# Patient Record
Sex: Female | Born: 1975 | Race: Black or African American | Hispanic: No | Marital: Single | Smoking: Never smoker
Health system: Southern US, Community
[De-identification: ages and names within clinical notes are randomized; demographics above are authoritative.]

---

## 2009-09-01 ENCOUNTER — Emergency Department (HOSPITAL_COMMUNITY): Admission: EM | Admit: 2009-09-01 | Discharge: 2009-09-01 | Payer: Self-pay | Admitting: Emergency Medicine

## 2010-04-18 ENCOUNTER — Emergency Department (HOSPITAL_COMMUNITY): Admission: EM | Admit: 2010-04-18 | Discharge: 2010-04-18 | Payer: Self-pay | Admitting: Emergency Medicine

## 2010-08-09 LAB — URINALYSIS, ROUTINE W REFLEX MICROSCOPIC
Bilirubin Urine: NEGATIVE
Ketones, ur: NEGATIVE mg/dL

## 2010-08-09 LAB — URINE MICROSCOPIC-ADD ON

## 2010-08-09 LAB — POCT PREGNANCY, URINE: Preg Test, Ur: NEGATIVE

## 2010-08-17 LAB — POCT I-STAT, CHEM 8
Calcium, Ion: 1.21 mmol/L (ref 1.12–1.32)
Chloride: 101 mEq/L (ref 96–112)
Creatinine, Ser: 1 mg/dL (ref 0.4–1.2)
Hemoglobin: 12.9 g/dL (ref 12.0–15.0)
Potassium: 3.9 mEq/L (ref 3.5–5.1)
Sodium: 135 mEq/L (ref 135–145)

## 2010-08-17 LAB — URINALYSIS, ROUTINE W REFLEX MICROSCOPIC
Bilirubin Urine: NEGATIVE
Glucose, UA: NEGATIVE mg/dL
Ketones, ur: 40 mg/dL — AB
Nitrite: POSITIVE — AB
Protein, ur: 100 mg/dL — AB
Specific Gravity, Urine: 1.018 (ref 1.005–1.030)
pH: 5.5 (ref 5.0–8.0)

## 2010-08-17 LAB — WET PREP, GENITAL
Trich, Wet Prep: NONE SEEN
Yeast Wet Prep HPF POC: NONE SEEN

## 2010-08-17 LAB — GC/CHLAMYDIA PROBE AMP, GENITAL: Chlamydia, DNA Probe: NEGATIVE

## 2010-08-17 LAB — DIFFERENTIAL
Basophils Relative: 0 % (ref 0–1)
Lymphocytes Relative: 2 % — ABNORMAL LOW (ref 12–46)
Monocytes Absolute: 0.8 10*3/uL (ref 0.1–1.0)
Neutro Abs: 16.4 10*3/uL — ABNORMAL HIGH (ref 1.7–7.7)

## 2010-08-17 LAB — CBC
HCT: 33.4 % — ABNORMAL LOW (ref 36.0–46.0)
MCHC: 33.9 g/dL (ref 30.0–36.0)
MCV: 83.9 fL (ref 78.0–100.0)
RDW: 14.5 % (ref 11.5–15.5)
WBC: 17.6 10*3/uL — ABNORMAL HIGH (ref 4.0–10.5)

## 2010-08-17 LAB — URINE MICROSCOPIC-ADD ON

## 2010-09-19 IMAGING — CT CT ABD-PELV W/O CM
2 of 4 series · 17 of 46 positions shown, 19 images · non-contrast
Comparison: None

CLINICAL DATA: Left-sided abdominal and pelvic pain with nausea and
vomiting.

CT ABDOMEN AND PELVIS WITHOUT CONTRAST
TECHNIQUE: Multidetector CT imaging of the abdomen and pelvis was
performed following the standard protocol without intravenous
contrast.

[Series 2: abd/pelv w/o 5.0 b31f st · axial · non-contrast · 0.64mm/px · z∈[-348,+12]mm · 14 of 80 slices shown, 16 images]
[im 4/80  soft-tissue]
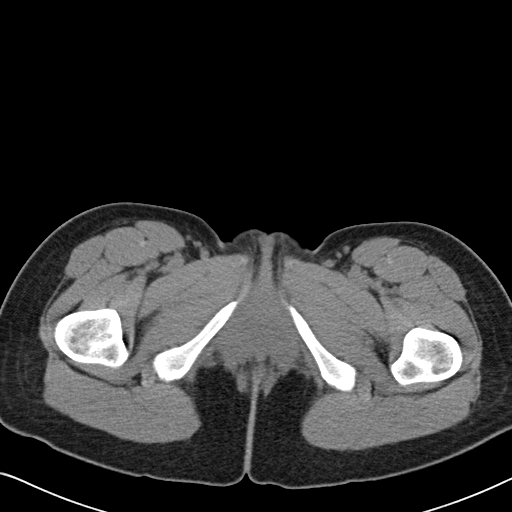
[im 4/80  bone]
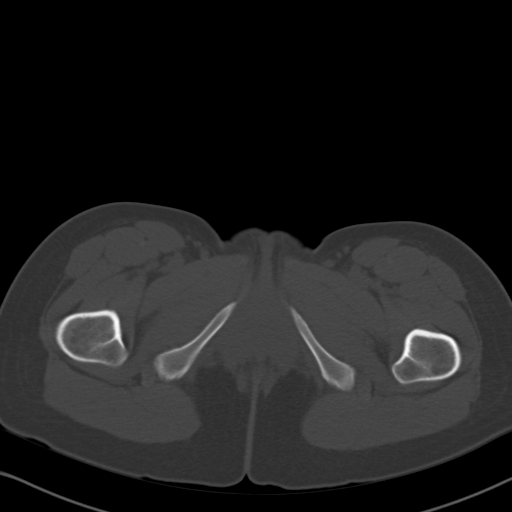
[im 10/80  soft-tissue]
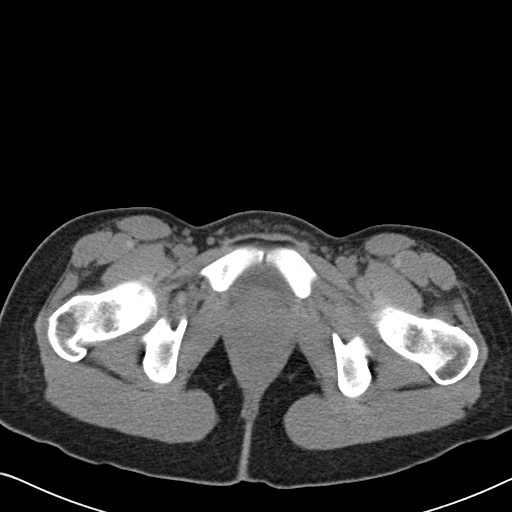
[im 17/80  soft-tissue]
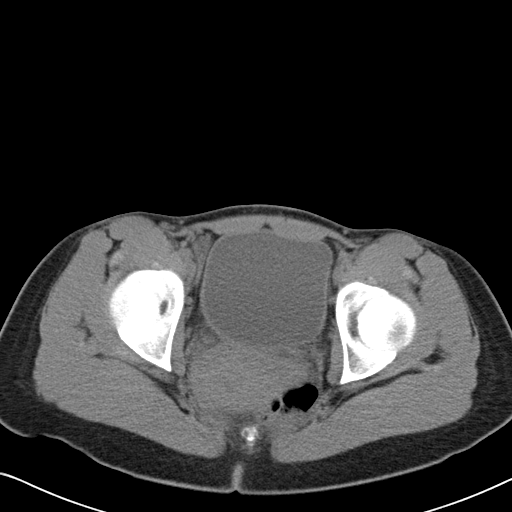
[im 20/80  soft-tissue]
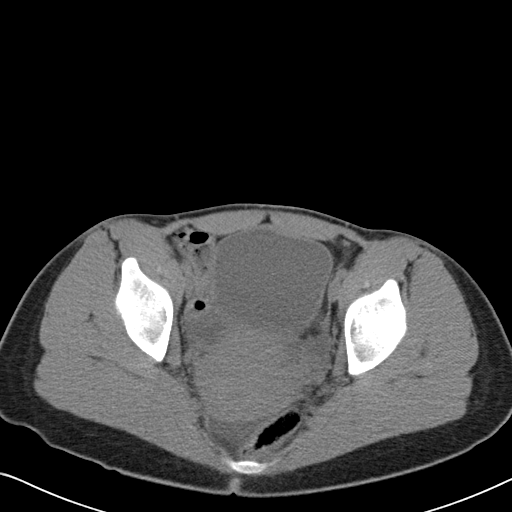
[im 27/80  soft-tissue]
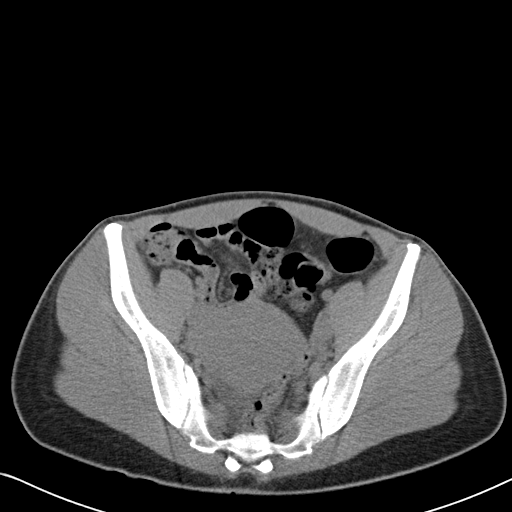
[im 33/80  soft-tissue]
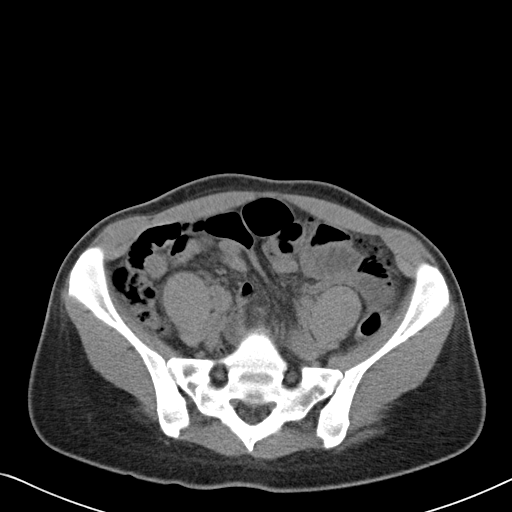
[im 37/80  soft-tissue]
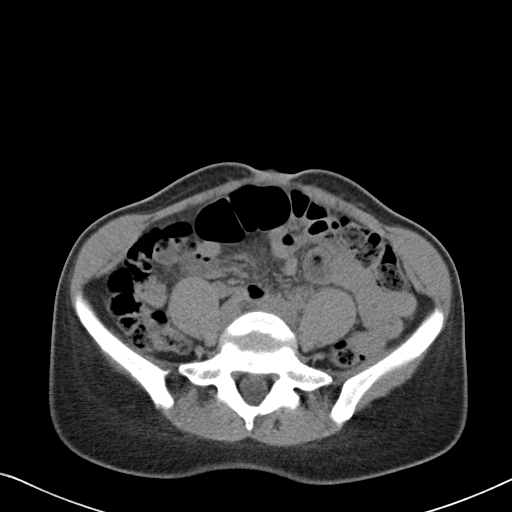
[im 43/80  soft-tissue]
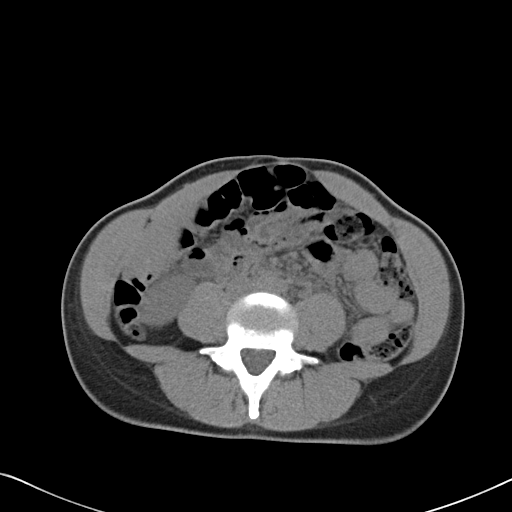
[im 47/80  soft-tissue]
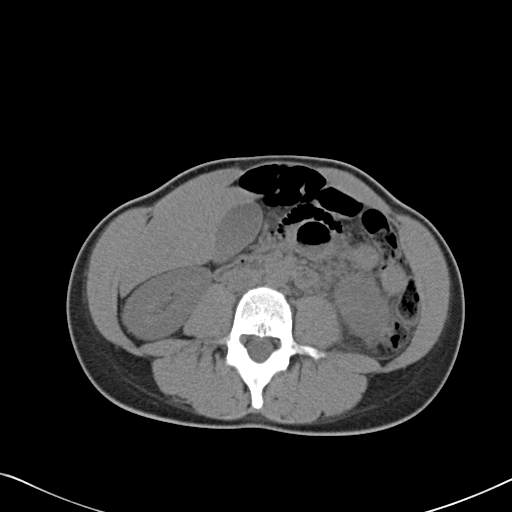
[im 47/80  bone]
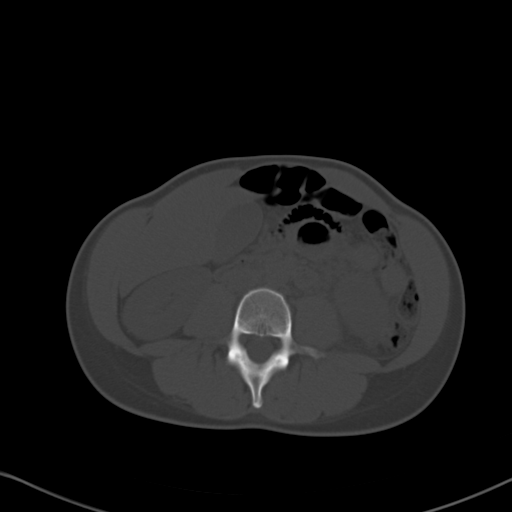
[im 53/80  soft-tissue]
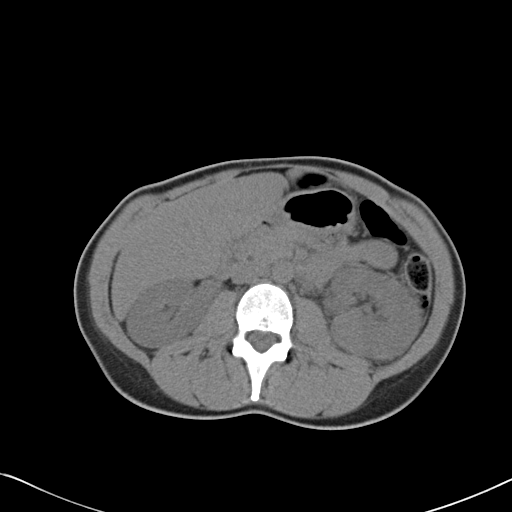
[im 60/80  soft-tissue]
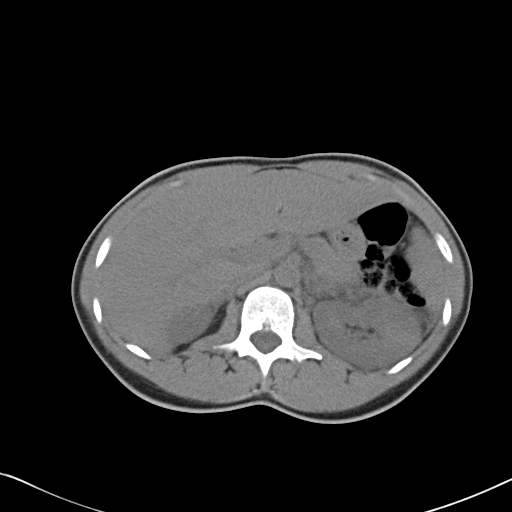
[im 63/80  soft-tissue]
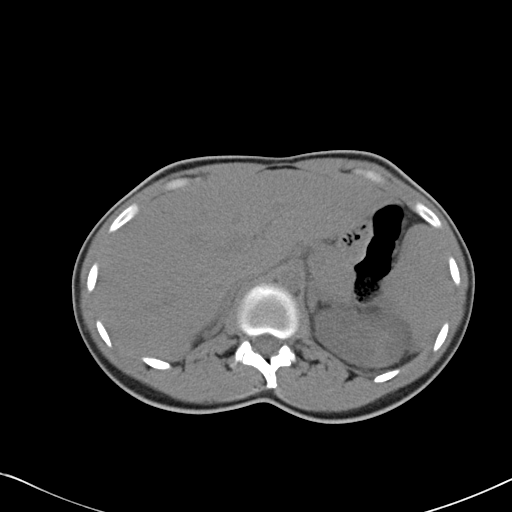
[im 70/80  soft-tissue]
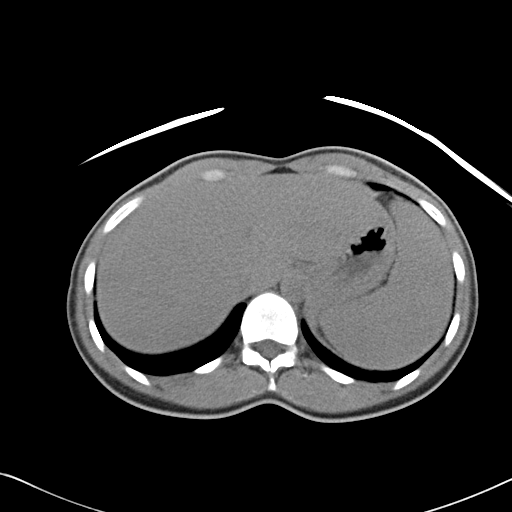
[im 76/80  soft-tissue]
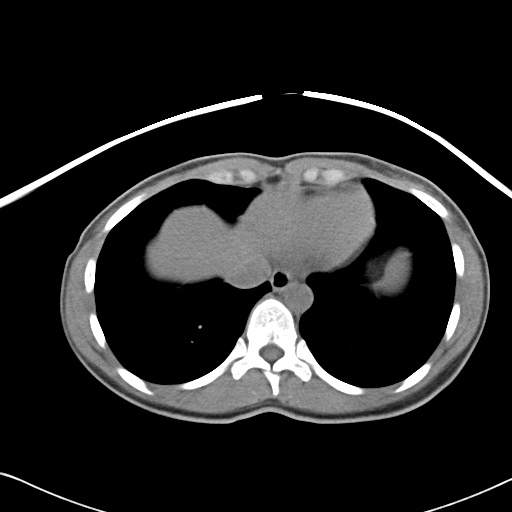

[Series 5: abd/pelv w/o 3.0 spo cor thins · coronal · non-contrast · 1.04mm/px · 3 of 52 slices shown]
[im 18/52  soft-tissue]
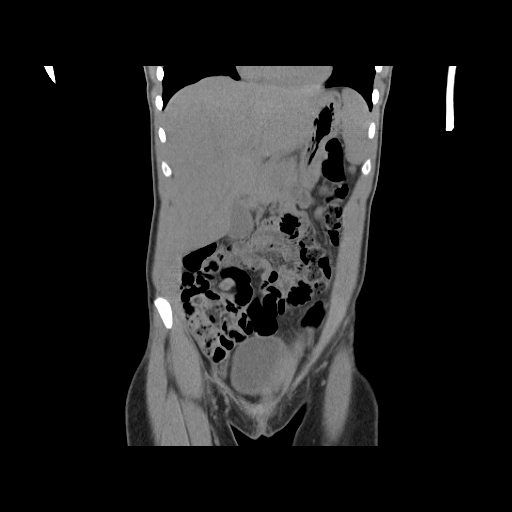
[im 23/52  soft-tissue]
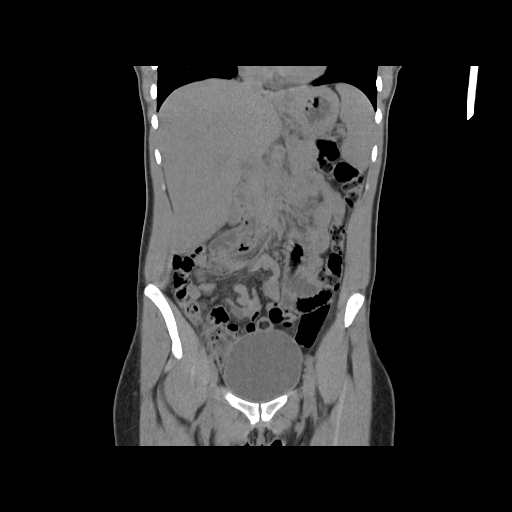
[im 29/52  soft-tissue]
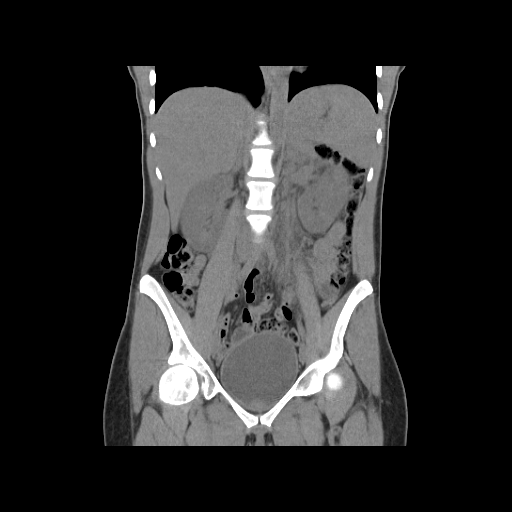

[17 of 46 positions shown; findings below may reference images not displayed]

FINDINGS: The liver, spleen, adrenal glands, right kidney,
gallbladder and pancreas are unremarkable.
Mild left perinephric inflammation is of uncertain chronicity.
Please note that parenchymal abnormalities may be missed as
intravenous contrast was not administered.
A small amount of free pelvic fluid is nonspecific but may be
physiologic.
There is no evidence of enlarged lymph nodes, biliary dilatation,
or abdominal aortic aneurysm.

The bowel and bladder are within normal limits.
No acute or suspicious bony abnormalities are identified.
IMPRESSION: Left perinephric inflammation of uncertain chronicity but may be a
reflection of acute inflammation or infection.  No evidence of
hydronephrosis or obstructing urinary calculi.

Small amount of free pelvic fluid - nonspecific but may be
physiologic.

## 2015-04-02 ENCOUNTER — Encounter (HOSPITAL_COMMUNITY): Payer: Self-pay | Admitting: Emergency Medicine

## 2015-04-02 DIAGNOSIS — R110 Nausea: Secondary | ICD-10-CM | POA: Insufficient documentation

## 2015-04-02 DIAGNOSIS — R1011 Right upper quadrant pain: Secondary | ICD-10-CM | POA: Insufficient documentation

## 2015-04-02 DIAGNOSIS — Z3202 Encounter for pregnancy test, result negative: Secondary | ICD-10-CM | POA: Insufficient documentation

## 2015-04-02 DIAGNOSIS — R030 Elevated blood-pressure reading, without diagnosis of hypertension: Secondary | ICD-10-CM | POA: Insufficient documentation

## 2015-04-02 LAB — CBC
HCT: 35.6 % — ABNORMAL LOW (ref 36.0–46.0)
HEMOGLOBIN: 12 g/dL (ref 12.0–15.0)
MCH: 28.3 pg (ref 26.0–34.0)
MCHC: 33.7 g/dL (ref 30.0–36.0)
MCV: 84 fL (ref 78.0–100.0)
PLATELETS: 209 10*3/uL (ref 150–400)
RBC: 4.24 MIL/uL (ref 3.87–5.11)
RDW: 12.9 % (ref 11.5–15.5)
WBC: 4.1 10*3/uL (ref 4.0–10.5)

## 2015-04-02 NOTE — ED Notes (Signed)
Pt. reports nausea and emesis onset this week , denies diarrhea , no fever or chills.

## 2015-04-03 ENCOUNTER — Emergency Department (HOSPITAL_COMMUNITY): Payer: Self-pay

## 2015-04-03 ENCOUNTER — Emergency Department (HOSPITAL_COMMUNITY)
Admission: EM | Admit: 2015-04-03 | Discharge: 2015-04-03 | Disposition: A | Discharge status: Home / Self Care | Payer: Self-pay | Attending: Emergency Medicine | Admitting: Emergency Medicine

## 2015-04-03 DIAGNOSIS — R030 Elevated blood-pressure reading, without diagnosis of hypertension: Secondary | ICD-10-CM

## 2015-04-03 DIAGNOSIS — R110 Nausea: Secondary | ICD-10-CM

## 2015-04-03 DIAGNOSIS — IMO0001 Reserved for inherently not codable concepts without codable children: Secondary | ICD-10-CM

## 2015-04-03 LAB — COMPREHENSIVE METABOLIC PANEL
ALT: 13 U/L — AB (ref 14–54)
ANION GAP: 10 (ref 5–15)
AST: 17 U/L (ref 15–41)
Albumin: 3.8 g/dL (ref 3.5–5.0)
Alkaline Phosphatase: 49 U/L (ref 38–126)
BUN: 5 mg/dL — ABNORMAL LOW (ref 6–20)
CHLORIDE: 104 mmol/L (ref 101–111)
CO2: 28 mmol/L (ref 22–32)
Calcium: 9.5 mg/dL (ref 8.9–10.3)
Creatinine, Ser: 0.87 mg/dL (ref 0.44–1.00)
GFR calc non Af Amer: >60 mL/min (ref >60)
Glucose, Bld: 102 mg/dL — ABNORMAL HIGH (ref 65–99)
POTASSIUM: 3.9 mmol/L (ref 3.5–5.1)
SODIUM: 142 mmol/L (ref 135–145)
Total Bilirubin: 0.5 mg/dL (ref 0.3–1.2)
Total Protein: 7 g/dL (ref 6.5–8.1)

## 2015-04-03 LAB — URINE MICROSCOPIC-ADD ON

## 2015-04-03 LAB — URINALYSIS, ROUTINE W REFLEX MICROSCOPIC
Bilirubin Urine: NEGATIVE
Glucose, UA: NEGATIVE mg/dL
Hgb urine dipstick: NEGATIVE
Ketones, ur: NEGATIVE mg/dL
NITRITE: NEGATIVE
Protein, ur: NEGATIVE mg/dL
SPECIFIC GRAVITY, URINE: 1.018 (ref 1.005–1.030)
UROBILINOGEN UA: 2 mg/dL — AB (ref 0.0–1.0)
pH: 6 (ref 5.0–8.0)

## 2015-04-03 LAB — POC URINE PREG, ED: PREG TEST UR: NEGATIVE

## 2015-04-03 MED ORDER — ONDANSETRON 4 MG PO TBDP
4.0000 mg | ORAL_TABLET | Freq: Once | ORAL | Status: AC
  Administered 2015-04-03: 4 mg via ORAL
  Filled 2015-04-03: qty 1

## 2015-04-03 MED ORDER — FAMOTIDINE 20 MG PO TABS: 20.0000 mg | ORAL_TABLET | Freq: Every day | ORAL | Status: AC

## 2015-04-03 MED ORDER — ONDANSETRON HCL 4 MG PO TABS: 4.0000 mg | ORAL_TABLET | Freq: Four times a day (QID) | ORAL | Status: AC

## 2015-04-03 NOTE — ED Notes (Signed)
Pt. Left with all belongings and refused wheelchair. Discharge instructions were reviewed and all questions were answered.  

## 2015-04-03 NOTE — Discharge Instructions (Signed)

## 2015-04-03 NOTE — ED Provider Notes (Signed)
CSN: [id number]     Arrival date & time 04/02/15  2307 History  By signing my name below, I, Terrance Branch, attest that this documentation has been prepared under the direction and in the presence of Loren Racer, MD. Electronically Signed: Evon Slack, ED Scribe. 04/03/2015. 2:29 AM.     Chief Complaint  Patient presents with  . Emesis     The history is provided by the patient. No language interpreter was used.   HPI Comments: Judith Friedman is a 39 y.o. female who presents to the Emergency Department complaining of nausea onset 1 week prior. Pt report associated vomiting tonight. Pt states no known exacerbating or alleviating factors. Pt does report irregular menstrual cycle this month. Pt states her last menstrual cycle was 2 weeks prior and then returned again 1 week prior. She states that her last BM was today. Denies abdominal pain, constipation, diarrhea, dysuria, frequency. Pt denies HX of frequent ibuprofen use. Pt denies Hx of abdominal surgeries.   History reviewed. No pertinent past medical history. History reviewed. No pertinent past surgical history. No family history on file. Social History  Substance Use Topics  . Smoking status: Never Smoker   . Smokeless tobacco: None  . Alcohol Use: Yes   OB History    No data available     Review of Systems  Constitutional: Negative for fever and chills.  Respiratory: Negative for shortness of breath.   Cardiovascular: Negative for chest pain, palpitations and leg swelling.  Gastrointestinal: Positive for nausea and vomiting. Negative for abdominal pain, diarrhea, constipation and blood in stool.  Genitourinary: Negative for dysuria, frequency, flank pain, vaginal bleeding, vaginal discharge, difficulty urinating and pelvic pain.  Musculoskeletal: Negative for myalgias and back pain.  Skin: Negative for rash and wound.  Neurological: Negative for dizziness, weakness, light-headedness, numbness and headaches.  All  other systems reviewed and are negative.     Allergies  Review of patient's allergies indicates no known allergies.  Home Medications   Prior to Admission medications   Medication Sig Start Date End Date Taking? Authorizing Provider  famotidine (PEPCID) 20 MG tablet Take 1 tablet (20 mg total) by mouth daily. 04/03/15   Loren Racer, MD  naproxen sodium (ANAPROX) 220 MG tablet Take 220 mg by mouth as needed (for pain).   Yes Historical Provider, MD  ondansetron (ZOFRAN) 4 MG tablet Take 1 tablet (4 mg total) by mouth every 6 (six) hours. 04/03/15   Loren Racer, MD   BP 150/94 mmHg  Pulse 58  Temp(Src) 97.9 F (36.6 C) (Oral)  Resp 14  SpO2 100%  LMP 03/26/2015   Physical Exam  Constitutional: She is oriented to person, place, and time. She appears well-developed and well-nourished. No distress.  HENT:  Head: Normocephalic and atraumatic.  Mouth/Throat: Oropharynx is clear and moist.  Eyes: EOM are normal. Pupils are equal, round, and reactive to light.  Neck: Normal range of motion. Neck supple.  Cardiovascular: Normal rate and regular rhythm.   Pulmonary/Chest: Effort normal and breath sounds normal. No respiratory distress. She has no wheezes. She has no rales.  Abdominal: Soft. Bowel sounds are normal. She exhibits no distension and no mass. There is tenderness (patient with right upper quadrant tenderness with palpation. There is no rebound or guarding.). There is no rebound and no guarding.  Musculoskeletal: Normal range of motion. She exhibits no edema or tenderness.  No CVA tenderness bilaterally.  Neurological: She is alert and oriented to person, place, and time.  Moves  all extremities without deficit. Sensation is fully intact.  Skin: Skin is warm and dry. No rash noted. No erythema.  Psychiatric: She has a normal mood and affect. Her behavior is normal.  Nursing note and vitals reviewed.   ED Course  Procedures (including critical care time) DIAGNOSTIC  STUDIES: Oxygen Saturation is 100% on RA, normal by my interpretation.    COORDINATION OF CARE: 2:29 AM-Discussed treatment plan with pt at bedside and pt agreed to plan.     Labs Review Labs Reviewed  COMPREHENSIVE METABOLIC PANEL - Abnormal; Notable for the following:    Glucose, Bld 102 (*)    BUN 5 (*)    ALT 13 (*)    All other components within normal limits  CBC - Abnormal; Notable for the following:    HCT 35.6 (*)    All other components within normal limits  URINALYSIS, ROUTINE W REFLEX MICROSCOPIC (NOT AT [facility]) - Abnormal; Notable for the following:    APPearance CLOUDY (*)    Urobilinogen, UA 2.0 (*)    Leukocytes, UA SMALL (*)    All other components within normal limits  URINE MICROSCOPIC-ADD ON - Abnormal; Notable for the following:    Bacteria, UA FEW (*)    All other components within normal limits  POC URINE PREG, ED    Imaging Review [place] Abdomen Limited  04/03/2015  CLINICAL DATA:  Right upper quadrant pain.  Nausea. EXAM: US ABDOMEN LIMITED - RIGHT UPPER QUADRANT COMPARISON:  CT, 09/01/2009 FINDINGS: Gallbladder: No gallstones or wall thickening visualized. No sonographic Murphy sign noted. Common bile duct: Diameter: 2.8 mm Liver: No focal lesion identified. Within normal limits in parenchymal echogenicity. IMPRESSION: Normal right upper quadrant ultrasound. Electronically Signed   By: Amie Portlandavid  Ormond M.D.   On: 04/03/2015 02:00      EKG Interpretation None      MDM   Final diagnoses:  Nausea  Elevated blood pressure     I personally performed the services described in this documentation, which was scribed in my presence. The recorded information has been reviewed and is accurate.   Patient admits to frequent use of naproxen. Ultrasound without evidence of gallstones. Will start on Pepcid and advise discontinuing all NSAIDs. Patient's been given return precautions. Will need follow-up with gastroenterology should her symptoms persist.  Patient  also been referred to health and wellness for elevated blood pressure.   Loren Raceravid Ambar Raphael, MD 04/03/15 0230

## 2015-04-03 NOTE — ED Notes (Signed)
Patient transported to Ultrasound 

## 2015-05-03 ENCOUNTER — Emergency Department (HOSPITAL_COMMUNITY)
Admission: EM | Admit: 2015-05-03 | Discharge: 2015-05-03 | Disposition: A | Discharge status: Home / Self Care | Payer: Self-pay | Attending: Emergency Medicine | Admitting: Emergency Medicine

## 2015-05-03 ENCOUNTER — Encounter (HOSPITAL_COMMUNITY): Payer: Self-pay | Admitting: Family Medicine

## 2015-05-03 DIAGNOSIS — R030 Elevated blood-pressure reading, without diagnosis of hypertension: Secondary | ICD-10-CM | POA: Insufficient documentation

## 2015-05-03 DIAGNOSIS — R2230 Localized swelling, mass and lump, unspecified upper limb: Secondary | ICD-10-CM | POA: Insufficient documentation

## 2015-05-03 DIAGNOSIS — Z79899 Other long term (current) drug therapy: Secondary | ICD-10-CM | POA: Insufficient documentation

## 2015-05-03 DIAGNOSIS — IMO0001 Reserved for inherently not codable concepts without codable children: Secondary | ICD-10-CM

## 2015-05-03 NOTE — ED Notes (Signed)
Pt st's her blood pressure has been elevated.  Has family hx of same.  Pt has no complaints at this time

## 2015-05-03 NOTE — Discharge Instructions (Signed)
1. Medications: usual home medications 2. Treatment: rest, drink plenty of fluids 3. Follow Up: please followup with your primary doctor for discussion of your diagnoses and further evaluation after today's visit; if you do not have a primary care doctor use the resource guide provided to find one; please return to the ER for severe headache, dizziness, lightheadedness, chest pain, shortness of breath, new or worsening symptoms   DASH Eating Plan DASH stands for "Dietary Approaches to Stop Hypertension." The DASH eating plan is a healthy eating plan that has been shown to reduce high blood pressure (hypertension). Additional health benefits may include reducing the risk of type 2 diabetes mellitus, heart disease, and stroke. The DASH eating plan may also help with weight loss. WHAT DO I NEED TO KNOW ABOUT THE DASH EATING PLAN? For the DASH eating plan, you will follow these general guidelines:  Choose foods with a percent daily value for sodium of less than 5% (as listed on the food label).  Use salt-free seasonings or herbs instead of table salt or sea salt.  Check with your health care provider or pharmacist before using salt substitutes.  Eat lower-sodium products, often labeled as "lower sodium" or "no salt added."  Eat fresh foods.  Eat more vegetables, fruits, and low-fat dairy products.  Choose whole grains. Look for the word "whole" as the first word in the ingredient list.  Choose fish and skinless chicken or [place] more often than red meat. Limit fish, poultry, and meat to 6 oz (170 g) each day.  Limit sweets, desserts, sugars, and sugary drinks.  Choose heart-healthy fats.  Limit cheese to 1 oz (28 g) per day.  Eat more home-cooked food and less restaurant, buffet, and fast food.  Limit fried foods.  Cook foods using methods other than frying.  Limit canned vegetables. If you do use them, rinse them well to decrease the sodium.  When eating at a restaurant, ask that  your food be prepared with less salt, or no salt if possible. WHAT FOODS CAN I EAT? Seek help from a dietitian for individual calorie needs. Grains Whole grain or whole wheat bread. Brown rice. Whole grain or whole wheat pasta. Quinoa, bulgur, and whole grain cereals. Low-sodium cereals. Corn or whole wheat flour tortillas. Whole grain cornbread. Whole grain crackers. Low-sodium crackers. Vegetables Fresh or frozen vegetables (raw, steamed, roasted, or grilled). Low-sodium or reduced-sodium tomato and vegetable juices. Low-sodium or reduced-sodium tomato sauce and paste. Low-sodium or reduced-sodium canned vegetables.  Fruits All fresh, canned (in natural juice), or frozen fruits. Meat and Other Protein Products Ground beef (85% or leaner), grass-fed beef, or beef trimmed of fat. Skinless chicken or [place]. Ground chicken or [place]. Pork trimmed of fat. All fish and seafood. Eggs. Dried beans, peas, or lentils. Unsalted nuts and seeds. Unsalted canned beans. Dairy Low-fat dairy products, such as skim or 1% milk, 2% or reduced-fat cheeses, low-fat ricotta or cottage cheese, or plain low-fat yogurt. Low-sodium or reduced-sodium cheeses. Fats and Oils Tub margarines without trans fats. Light or reduced-fat mayonnaise and salad dressings (reduced sodium). Avocado. Safflower, olive, or canola oils. Natural peanut or almond butter. Other Unsalted popcorn and pretzels. The items listed above may not be a complete list of recommended foods or beverages. Contact your dietitian for more options. WHAT FOODS ARE NOT RECOMMENDED? Grains White bread. White pasta. White rice. Refined cornbread. Bagels and croissants. Crackers that contain trans fat. Vegetables Creamed or fried vegetables. Vegetables in a cheese sauce. Regular canned vegetables. Regular  canned tomato sauce and paste. Regular tomato and vegetable juices. Fruits Dried fruits. Canned fruit in light or heavy syrup. Fruit juice. Meat and Other  Protein Products Fatty cuts of meat. Ribs, chicken wings, bacon, sausage, bologna, salami, chitterlings, fatback, hot dogs, bratwurst, and packaged luncheon meats. Salted nuts and seeds. Canned beans with salt. Dairy Whole or 2% milk, cream, half-and-half, and cream cheese. Whole-fat or sweetened yogurt. Full-fat cheeses or blue cheese. Nondairy creamers and whipped toppings. Processed cheese, cheese spreads, or cheese curds. Condiments Onion and garlic salt, seasoned salt, table salt, and sea salt. Canned and packaged gravies. Worcestershire sauce. Tartar sauce. Barbecue sauce. Teriyaki sauce. Soy sauce, including reduced sodium. Steak sauce. Fish sauce. Oyster sauce. Cocktail sauce. Horseradish. Ketchup and mustard. Meat flavorings and tenderizers. Bouillon cubes. Hot sauce. Tabasco sauce. Marinades. Taco seasonings. Relishes. Fats and Oils Butter, stick margarine, lard, shortening, ghee, and bacon fat. Coconut, palm kernel, or palm oils. Regular salad dressings. Other Pickles and olives. Salted popcorn and pretzels. The items listed above may not be a complete list of foods and beverages to avoid. Contact your dietitian for more information. WHERE CAN I FIND MORE INFORMATION? National Heart, Lung, and Blood Institute: [employer]   This information is not intended to replace advice given to you by your health care provider. Make sure you discuss any questions you have with your health care provider.   Document Released: 05/04/2011 Document Revised: 06/05/2014 Document Reviewed: 03/19/2013 Elsevier Interactive Patient Education 2016 [employer].  Hypertension Hypertension, commonly called high blood pressure, is when the force of blood pumping through your arteries is too strong. Your arteries are the blood vessels that carry blood from your heart throughout your body. A blood pressure reading consists of a higher number over a lower number, such as  110/72. The higher number (systolic) is the pressure inside your arteries when your heart pumps. The lower number (diastolic) is the pressure inside your arteries when your heart relaxes. Ideally you want your blood pressure below 120/80. Hypertension forces your heart to work harder to pump blood. Your arteries may become narrow or stiff. Having untreated or uncontrolled hypertension can cause heart attack, stroke, kidney disease, and other problems. RISK FACTORS Some risk factors for high blood pressure are controllable. Others are not.  Risk factors you cannot control include:   Race. You may be at higher risk if you are African American.  Age. Risk increases with age.  Gender. Men are at higher risk than women before age 30 years. After age 36, women are at higher risk than men. Risk factors you can control include:  Not getting enough exercise or physical activity.  Being overweight.  Getting too much fat, sugar, calories, or salt in your diet.  Drinking too much alcohol. SIGNS AND SYMPTOMS Hypertension does not usually cause signs or symptoms. Extremely high blood pressure (hypertensive crisis) may cause headache, anxiety, shortness of breath, and nosebleed. DIAGNOSIS To check if you have hypertension, your health care provider will measure your blood pressure while you are seated, with your arm held at the level of your heart. It should be measured at least twice using the same arm. Certain conditions can cause a difference in blood pressure between your right and left arms. A blood pressure reading that is higher than normal on one occasion does not mean that you need treatment. If it is not clear whether you have high blood pressure, you may be asked to return on a different day to have your blood  pressure checked again. Or, you may be asked to monitor your blood pressure at home for 1 or more weeks. TREATMENT Treating high blood pressure includes making lifestyle changes and  possibly taking medicine. Living a healthy lifestyle can help lower high blood pressure. You may need to change some of your habits. Lifestyle changes may include:  Following the DASH diet. This diet is high in fruits, vegetables, and whole grains. It is low in salt, red meat, and added sugars.  Keep your sodium intake below 2,300 mg per day.  Getting at least 30-45 minutes of aerobic exercise at least 4 times per week.  Losing weight if necessary.  Not smoking.  Limiting alcoholic beverages.  Learning ways to reduce stress. Your health care provider may prescribe medicine if lifestyle changes are not enough to get your blood pressure under control, and if one of the following is true:  You are 7418-39 years of age and your systolic blood pressure is above 140.  You are 39 years of age or older, and your systolic blood pressure is above 150.  Your diastolic blood pressure is above 90.  You have diabetes, and your systolic blood pressure is over 140 or your diastolic blood pressure is over 90.  You have kidney disease and your blood pressure is above 140/90.  You have heart disease and your blood pressure is above 140/90. Your personal target blood pressure may vary depending on your medical conditions, your age, and other factors. HOME CARE INSTRUCTIONS  Have your blood pressure rechecked as directed by your health care provider.   Take medicines only as directed by your health care provider. Follow the directions carefully. Blood pressure medicines must be taken as prescribed. The medicine does not work as well when you skip doses. Skipping doses also puts you at risk for problems.  Do not smoke.   Monitor your blood pressure at home as directed by your health care provider. SEEK MEDICAL CARE IF:   You think you are having a reaction to medicines taken.  You have recurrent headaches or feel dizzy.  You have swelling in your ankles.  You have trouble with your  vision. SEEK IMMEDIATE MEDICAL CARE IF:  You develop a severe headache or confusion.  You have unusual weakness, numbness, or feel faint.  You have severe chest or abdominal pain.  You vomit repeatedly.  You have trouble breathing. MAKE SURE YOU:   Understand these instructions.  Will watch your condition.  Will get help right away if you are not doing well or get worse.   This information is not intended to replace advice given to you by your health care provider. Make sure you discuss any questions you have with your health care provider.   Document Released: 05/15/2005 Document Revised: 09/29/2014 Document Reviewed: 03/07/2013 Elsevier Interactive Patient Education [employer] Elsevier Inc.

## 2015-05-03 NOTE — ED Provider Notes (Signed)
CSN: [id number]     Arrival date & time 05/03/15  1626 History   First MD Initiated Contact with Patient 05/03/15 1815     Chief Complaint  Patient presents with  . Hypertension    HPI   Judith Friedman is a 39 y.o. female with no pertinent PMH who presents to the ED with elevated BP. She states her hands were swelling yesterday, so she took her BP, and it was elevated to 160s/100s. She denies additional symptoms. She reports she checked her BP again today, and it was still elevated, prompting her to come to the ED. She denies headache, lightheadedness, dizziness, vision changes, chest pain, shortness of breath, abdominal pain, N/V/D/C, numbness, weakness, paresthesia.   History reviewed. No pertinent past medical history. History reviewed. No pertinent past surgical history. History reviewed. No pertinent family history. Social History  Substance Use Topics  . Smoking status: Never Smoker   . Smokeless tobacco: None  . Alcohol Use: Yes   OB History    No data available      Review of Systems  Constitutional: Negative for fever and chills.  Eyes: Negative for visual disturbance.  Respiratory: Negative for shortness of breath.   Cardiovascular: Negative for chest pain.  Gastrointestinal: Negative for nausea, vomiting, abdominal pain, diarrhea and constipation.  Neurological: Negative for dizziness, syncope, weakness, light-headedness, numbness and headaches.  All other systems reviewed and are negative.     Allergies  Review of patient's allergies indicates no known allergies.  Home Medications   Prior to Admission medications   Medication Sig Start Date End Date Taking? Authorizing Provider  famotidine (PEPCID) 20 MG tablet Take 1 tablet (20 mg total) by mouth daily. 04/03/15   Loren Racer, MD  ondansetron (ZOFRAN) 4 MG tablet Take 1 tablet (4 mg total) by mouth every 6 (six) hours. 04/03/15   Loren Racer, MD    BP 144/77 mmHg  Pulse 60  Temp(Src) 98.7 F (37.1  C) (Oral)  Resp 16  Ht  (1.6 m)  Wt 56.246 kg  BMI 21.97 kg/m2  SpO2 100%  LMP 04/22/2015 Physical Exam  Constitutional: She is oriented to person, place, and time. She appears well-developed and well-nourished. No distress.  HENT:  Head: Normocephalic and atraumatic.  Right Ear: External ear normal.  Left Ear: External ear normal.  Nose: Nose normal.  Mouth/Throat: Uvula is midline, oropharynx is clear and moist and mucous membranes are normal.  Eyes: Conjunctivae, EOM and lids are normal. Pupils are equal, round, and reactive to light. Right eye exhibits no discharge. Left eye exhibits no discharge. No scleral icterus.  Neck: Normal range of motion. Neck supple.  Cardiovascular: Normal rate, regular rhythm, normal heart sounds, intact distal pulses and normal pulses.   Pulmonary/Chest: Effort normal and breath sounds normal. No respiratory distress. She has no wheezes. She has no rales.  Abdominal: Soft. Normal appearance and bowel sounds are normal. She exhibits no distension and no mass. There is no tenderness. There is no rigidity, no rebound and no guarding.  Musculoskeletal: Normal range of motion. She exhibits no edema or tenderness.  Neurological: She is alert and oriented to person, place, and time. She has normal strength. No cranial nerve deficit or sensory deficit.  Skin: Skin is warm, dry and intact. No rash noted. She is not diaphoretic. No erythema. No pallor.  Psychiatric: She has a normal mood and affect. Her speech is normal and behavior is normal.  Nursing note and vitals reviewed.   ED  Course  Procedures (including critical care time)  Labs Review Labs Reviewed - No data to display  Imaging Review No results found.     EKG Interpretation None      MDM   Final diagnoses:  Elevated BP    39 year old female presents with elevated blood pressure x 2 days. She denies headache, lightheadedness, dizziness, vision changes, chest pain, shortness of  breath, abdominal pain, N/V/D/C, numbness, weakness, paresthesia.  Patient is afebrile. Hypertensive to 160s/100s. Patient was evaluated in the ED 1 month ago, appears blood pressure is stable from that time. Heart regular rate and rhythm. Lungs clear to auscultation bilaterally. Abdomen soft, nontender, nondistended. No lower extremity edema. Normal neuro exam with no focal deficit.  No signs of hypertensive urgency or emergency. BO improved to 144/77 prior to discharge. Discussed following up with primary care provider for possible initiation of antihypertensive medications. Patient is in agreement with plan. Spoke with patient at length regarding return precautions. Patient verbalizes her understanding.  BP 144/77 mmHg  Pulse 60  Temp(Src) 98.7 F (37.1 C) (Oral)  Resp 16  Ht 5\' 3"  (1.6 m)  Wt 56.246 kg  BMI 21.97 kg/m2  SpO2 100%  LMP 04/22/2015   Mady GemmaElizabeth C Westfall, PA-C 05/03/15 1932  Loren Raceravid Yelverton, MD 05/03/15 2220

## 2015-05-03 NOTE — ED Notes (Signed)
Pt here for hypertension. sts 175/100 yesterday and 160/100 today. sts she feels okay. sts family hx of htn

## 2016-08-09 IMAGING — US US ABDOMEN LIMITED
1 series · 14 of 25 positions shown · non-contrast
Comparison: CT, 09/01/2009

CLINICAL DATA: Right upper quadrant pain.  Nausea.

EXAM:
US ABDOMEN LIMITED - RIGHT UPPER QUADRANT

[Series 1: us abdomen limited · 0.19mm/px · 14 of 34 slices shown]
[im 1/34]
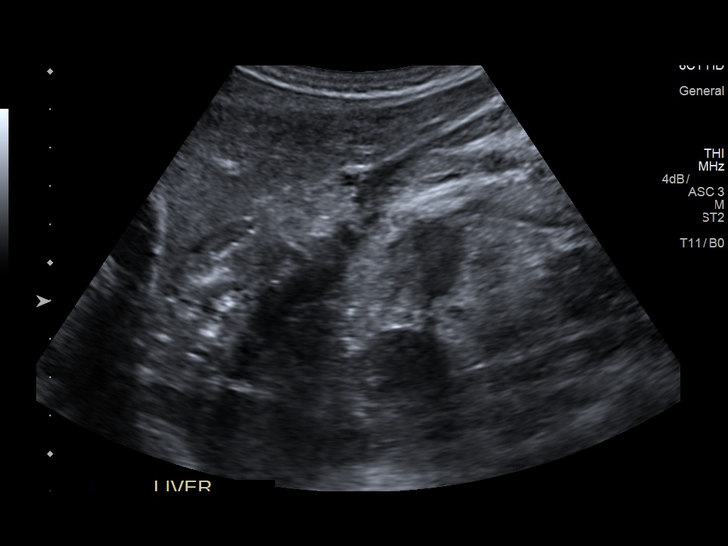
[im 3/34]
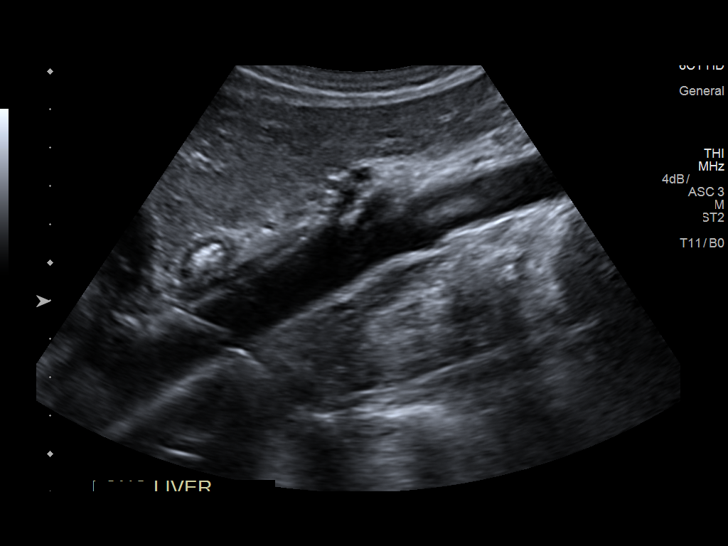
[im 6/34]
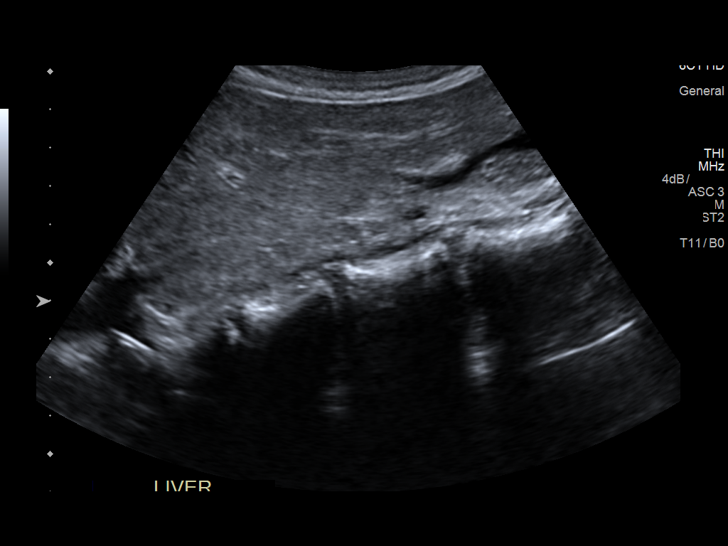
[im 9/34]
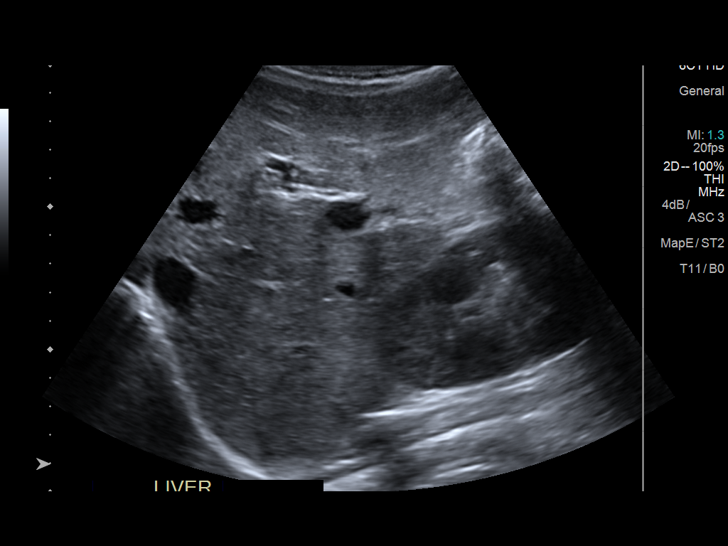
[im 12/34]
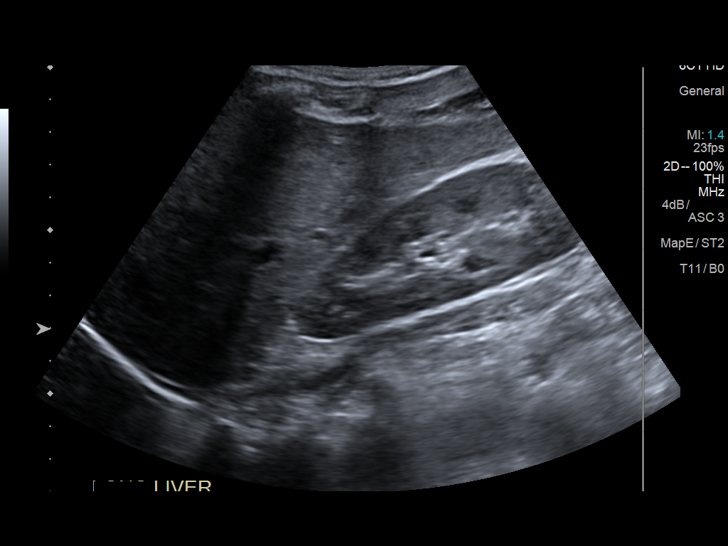
[im 13/34]
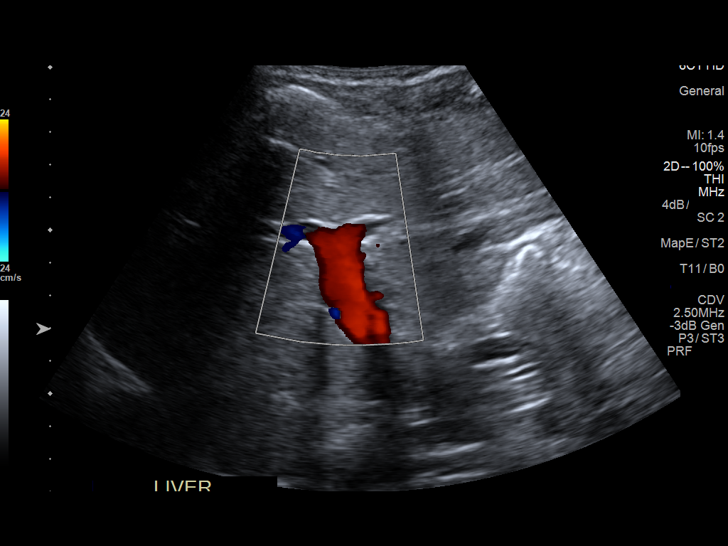
[im 16/34]
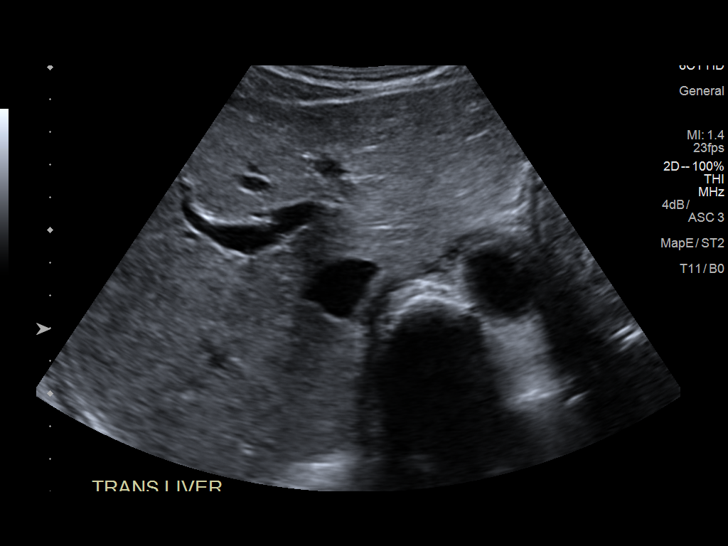
[im 18/34]
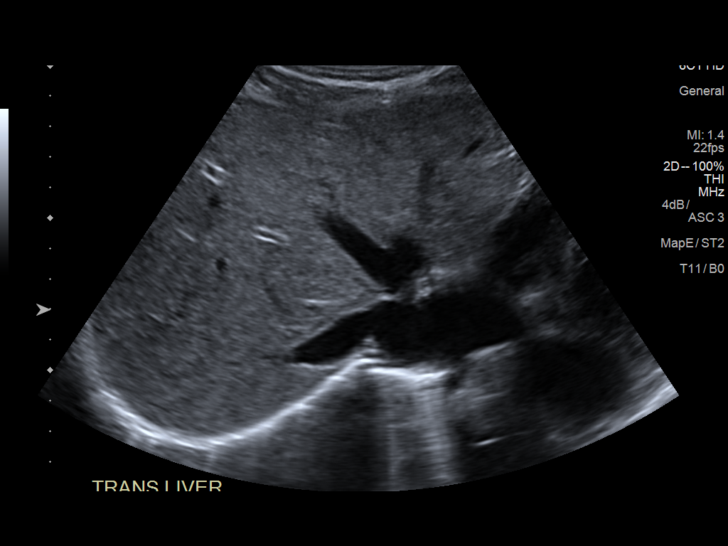
[im 21/34]
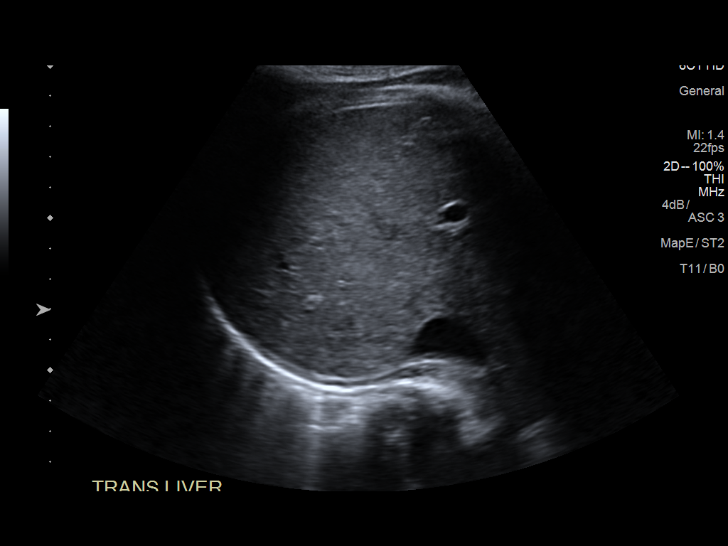
[im 23/34]
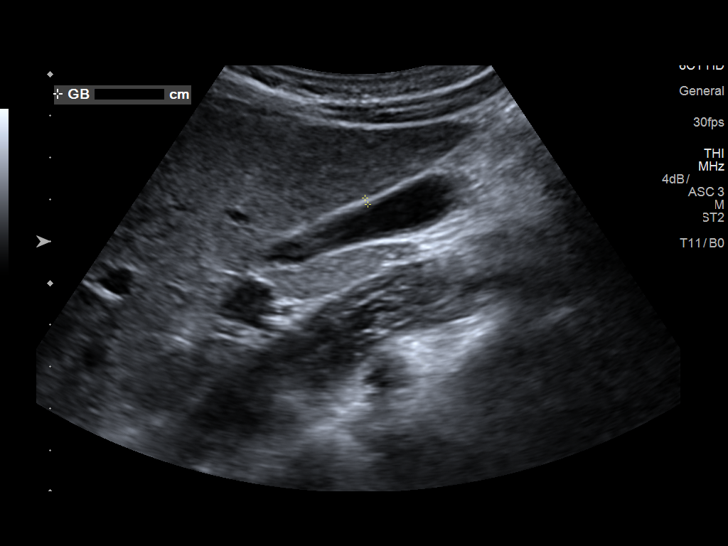
[im 25/34]
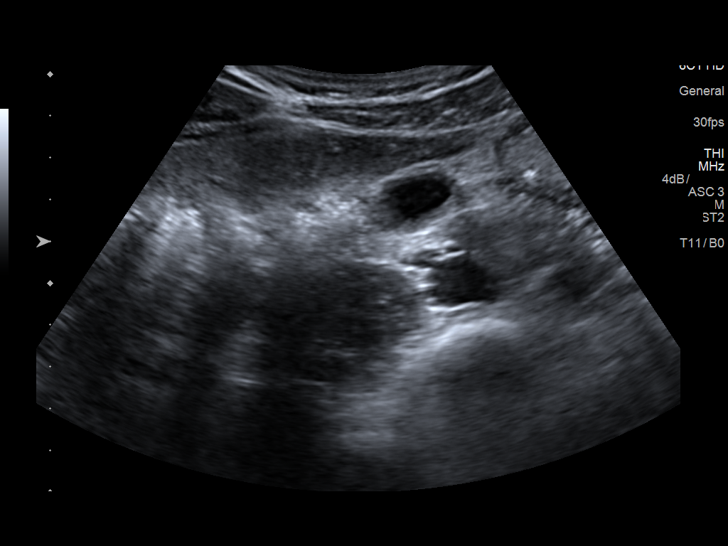
[im 28/34]
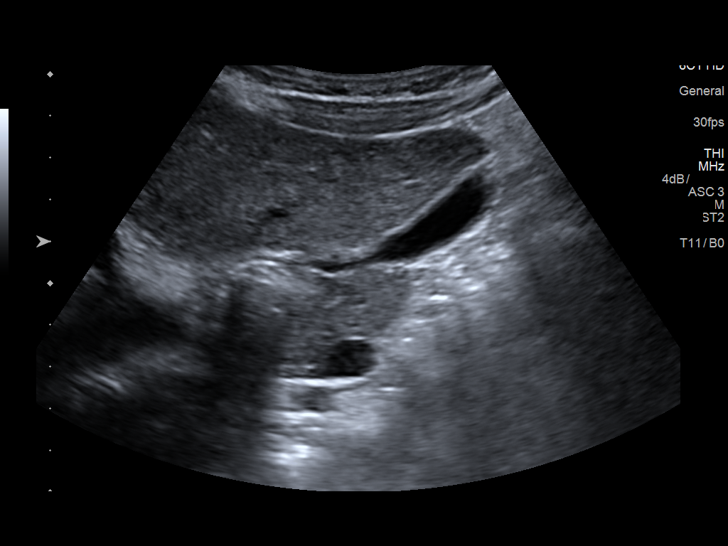
[im 31/34]
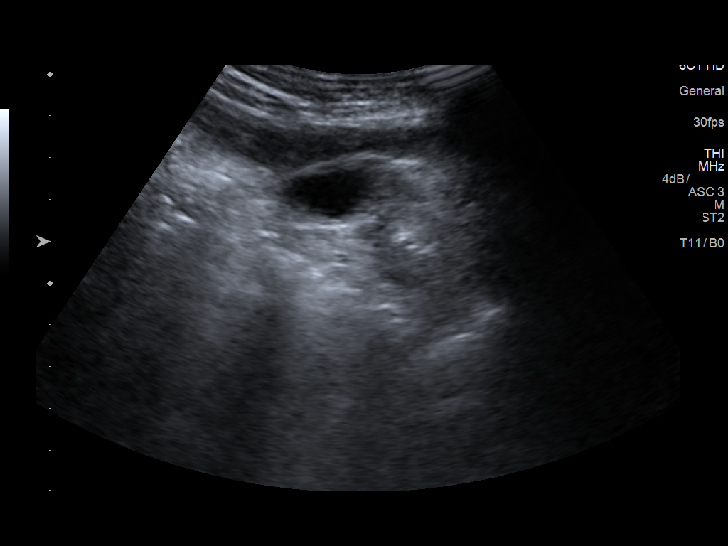
[im 34/34]
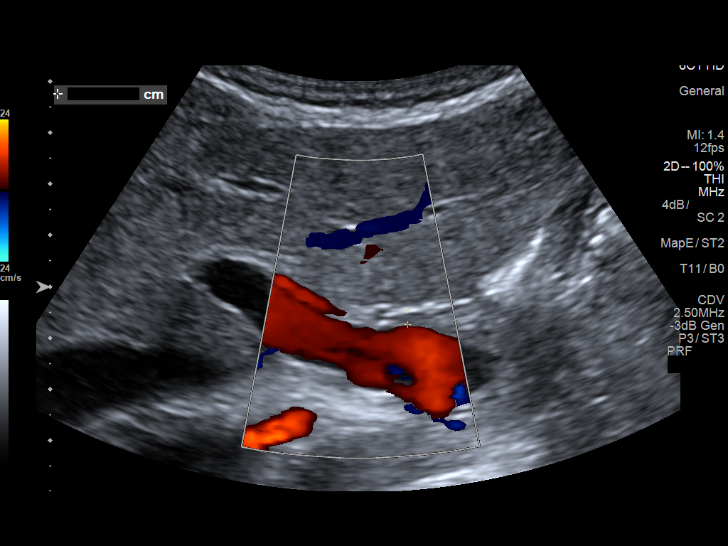

[14 of 25 positions shown; findings below may reference images not displayed]

FINDINGS: Gallbladder:

No gallstones or wall thickening visualized. No sonographic Murphy
sign noted.

Common bile duct:

Diameter: 2.8 mm

Liver:

No focal lesion identified. Within normal limits in parenchymal
echogenicity.
IMPRESSION: Normal right upper quadrant ultrasound.
